# Patient Record
Sex: Male | Born: 2000 | Race: Black or African American | Hispanic: No | Marital: Single | Smoking: Never smoker
Health system: Southern US, Community
[De-identification: ages and names within clinical notes are randomized; demographics above are authoritative.]

## PROBLEM LIST (undated history)

## (undated) DIAGNOSIS — G4730 Sleep apnea, unspecified: Secondary | ICD-10-CM

## (undated) DIAGNOSIS — S060X9A Concussion with loss of consciousness of unspecified duration, initial encounter: Secondary | ICD-10-CM

## (undated) HISTORY — DX: Sleep apnea, unspecified: G47.30

## (undated) HISTORY — PX: KNEE SURGERY: SHX244

---
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
## 2018-02-11 ENCOUNTER — Other Ambulatory Visit: Payer: Self-pay

## 2018-02-11 ENCOUNTER — Emergency Department (HOSPITAL_COMMUNITY)
Admission: EM | Admit: 2018-02-11 | Discharge: 2018-02-11 | Disposition: A | Discharge status: Home / Self Care | Payer: No Typology Code available for payment source | Attending: Emergency Medicine | Admitting: Emergency Medicine

## 2018-02-11 ENCOUNTER — Emergency Department (HOSPITAL_COMMUNITY): Payer: No Typology Code available for payment source

## 2018-02-11 ENCOUNTER — Encounter (HOSPITAL_COMMUNITY): Payer: Self-pay | Admitting: Emergency Medicine

## 2018-02-11 DIAGNOSIS — S0990XA Unspecified injury of head, initial encounter: Secondary | ICD-10-CM | POA: Diagnosis present

## 2018-02-11 DIAGNOSIS — Y999 Unspecified external cause status: Secondary | ICD-10-CM | POA: Diagnosis not present

## 2018-02-11 DIAGNOSIS — S0083XA Contusion of other part of head, initial encounter: Secondary | ICD-10-CM | POA: Insufficient documentation

## 2018-02-11 DIAGNOSIS — Z79899 Other long term (current) drug therapy: Secondary | ICD-10-CM | POA: Diagnosis not present

## 2018-02-11 DIAGNOSIS — S0093XA Contusion of unspecified part of head, initial encounter: Secondary | ICD-10-CM

## 2018-02-11 DIAGNOSIS — Y9389 Activity, other specified: Secondary | ICD-10-CM | POA: Diagnosis not present

## 2018-02-11 DIAGNOSIS — Y929 Unspecified place or not applicable: Secondary | ICD-10-CM | POA: Diagnosis not present

## 2018-02-11 HISTORY — DX: Concussion with loss of consciousness of unspecified duration, initial encounter: S06.0X9A

## 2018-02-11 NOTE — Discharge Instructions (Addendum)
Tylenol for headaches.  Return if any problems.   

## 2018-02-11 NOTE — ED Notes (Signed)
Pt called out asking how long his stay might be.  This RN updated

## 2018-02-11 NOTE — ED Notes (Signed)
Patient transported to CT via wheelchair
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
## 2018-02-11 NOTE — ED Notes (Signed)
Patient able to ambulate independently  

## 2019-02-08 ENCOUNTER — Encounter (HOSPITAL_COMMUNITY): Payer: Self-pay

## 2019-02-08 ENCOUNTER — Ambulatory Visit (HOSPITAL_COMMUNITY)
Admission: EM | Admit: 2019-02-08 | Discharge: 2019-02-08 | Disposition: A | Discharge status: Home / Self Care | Payer: Medicaid Other | Attending: Emergency Medicine | Admitting: Emergency Medicine

## 2019-02-08 ENCOUNTER — Other Ambulatory Visit: Payer: Self-pay

## 2019-02-08 DIAGNOSIS — K921 Melena: Secondary | ICD-10-CM | POA: Diagnosis present

## 2019-02-08 DIAGNOSIS — Z202 Contact with and (suspected) exposure to infections with a predominantly sexual mode of transmission: Secondary | ICD-10-CM | POA: Insufficient documentation

## 2019-02-08 DIAGNOSIS — R369 Urethral discharge, unspecified: Secondary | ICD-10-CM | POA: Diagnosis not present

## 2019-02-08 LAB — OCCULT BLOOD, POC DEVICE: Fecal Occult Bld: POSITIVE — AB

## 2019-02-08 MED ORDER — LIDOCAINE HCL (PF) 1 % IJ SOLN
INTRAMUSCULAR | Status: AC
  Filled 2019-02-08: qty 2

## 2019-02-08 MED ORDER — AZITHROMYCIN 250 MG PO TABS
1000.0000 mg | ORAL_TABLET | Freq: Once | ORAL | Status: AC
  Administered 2019-02-08: 1000 mg via ORAL

## 2019-02-08 MED ORDER — CEFTRIAXONE SODIUM 250 MG IJ SOLR
250.0000 mg | Freq: Once | INTRAMUSCULAR | Status: AC
  Administered 2019-02-08: 250 mg via INTRAMUSCULAR

## 2019-02-08 MED ORDER — AZITHROMYCIN 250 MG PO TABS
ORAL_TABLET | ORAL | Status: AC
  Filled 2019-02-08: qty 4

## 2019-02-08 MED ORDER — CEFTRIAXONE SODIUM 250 MG IJ SOLR
INTRAMUSCULAR | Status: AC
  Filled 2019-02-08: qty 250

## 2019-02-08 NOTE — Discharge Instructions (Addendum)
You have blood in your stool.  Follow-up with your primary care provider or the provider listed below next week.   Schedule an appointment with a gastroenterologist to have this evaluated.    You were treated with two antibiotics today, Rocephin and Zithromax.  Do not have sex for 7 days. Your STD tests are pending.  If your test results are positive, we will call you.  You may need additional treatment and your partner may also need treatment.

## 2019-02-08 NOTE — ED Provider Notes (Signed)
[facility]    CSN: [id number] Arrival date & time: 02/08/19  1752      History   Chief Complaint Chief Complaint  Patient presents with  . Blood in Stool    HPI Philip Garcia is a 18 y.o. male.   Patient presents with red blood in his stool intermittently x several months.  He also has creamy yellow penile discharge.  He requests STD testing and treatment, including HIV and RPR.  He denies testicular pain, dysuria, abdominal pain, back pain, fever, or other symptoms.    The history is provided by the patient.    Past Medical History:  Diagnosis Date  . Concussion 2018   playing football; had 2 in 2017    There are no active problems to display for this patient.   Past Surgical History:  Procedure Laterality Date  . KNEE SURGERY Bilateral        Home Medications    Prior to Admission medications   Medication Sig Start Date End Date Taking? Authorizing Provider  naproxen (NAPROSYN) 500 MG tablet Take 1 tablet (500 mg total) by mouth 2 (two) times daily with a meal. 09/25/16   Georgiana ShoreMitchell, Jessica B, PA-C    Family History Family History  Family history unknown: Yes    Social History Social History   Tobacco Use  . Smoking status: Never Smoker  . Smokeless tobacco: Never Used  Substance Use Topics  . Alcohol use: Never    Frequency: Never  . Drug use: Never     Allergies   Patient has no known allergies.   Review of Systems Review of Systems  Constitutional: Negative for chills and fever.  HENT: Negative for ear pain and sore throat.   Eyes: Negative for pain and visual disturbance.  Respiratory: Negative for cough and shortness of breath.   Cardiovascular: Negative for chest pain and palpitations.  Gastrointestinal: Positive for blood in stool. Negative for abdominal pain, constipation, diarrhea and vomiting.  Genitourinary: Positive for discharge. Negative for dysuria, flank pain, hematuria and testicular pain.  Musculoskeletal:  Negative for arthralgias and back pain.  Skin: Negative for color change and rash.  Neurological: Negative for seizures and syncope.  All other systems reviewed and are negative.    Physical Exam Triage Vital Signs ED Triage Vitals [02/08/19 1821]  Enc Vitals Group     BP 120/73     Pulse Rate (!) 57     Resp 17     Temp 98.5 F (36.9 C)     Temp Source Oral     SpO2 99 %     Weight      Height      Head Circumference      Peak Flow      Pain Score      Pain Loc      Pain Edu?      Excl. in GC?    No data found.  Updated Vital Signs BP 120/73 (BP Location: Right Arm)   Pulse (!) 57   Temp 98.5 F (36.9 C) (Oral)   Resp 17   SpO2 99%   Visual Acuity Right Eye Distance:   Left Eye Distance:   Bilateral Distance:    Right Eye Near:   Left Eye Near:    Bilateral Near:     Physical Exam Vitals signs and nursing note reviewed.  Constitutional:      Appearance: He is well-developed.  HENT:     Head: Normocephalic and  atraumatic.     Mouth/Throat:     Mouth: Mucous membranes are moist.     Pharynx: Oropharynx is clear.  Eyes:     Conjunctiva/sclera: Conjunctivae normal.  Neck:     Musculoskeletal: Neck supple.  Cardiovascular:     Rate and Rhythm: Normal rate and regular rhythm.     Heart sounds: No murmur.  Pulmonary:     Effort: Pulmonary effort is normal. No respiratory distress.     Breath sounds: Normal breath sounds.  Abdominal:     General: Bowel sounds are normal.     Palpations: Abdomen is soft.     Tenderness: There is no abdominal tenderness. There is no right CVA tenderness, left CVA tenderness, guarding or rebound.  Genitourinary:    Penis: Discharge present.      Scrotum/Testes: Normal.     Epididymis:     Right: Normal.     Left: Normal.  Skin:    General: Skin is warm and dry.     Findings: No rash.  Neurological:     Mental Status: He is alert.      UC Treatments / Results  Labs (all labs ordered are listed, but only  abnormal results are displayed) Labs Reviewed  HIV ANTIBODY (ROUTINE TESTING W REFLEX)  RPR  POC OCCULT BLOOD, ED  CYTOLOGY, (ORAL, ANAL, URETHRAL) ANCILLARY ONLY    EKG   Radiology No results found.  Procedures Procedures (including critical care time)  Medications Ordered in UC Medications  azithromycin (ZITHROMAX) tablet 1,000 mg (has no administration in time range)  cefTRIAXone (ROCEPHIN) injection 250 mg (has no administration in time range)  azithromycin (ZITHROMAX) 250 MG tablet (has no administration in time range)  cefTRIAXone (ROCEPHIN) 250 MG injection (has no administration in time range)  lidocaine (PF) (XYLOCAINE) 1 % injection (has no administration in time range)    Initial Impression / Assessment and Plan / UC Course  I have reviewed the triage vital signs and the nursing notes.  Pertinent labs & imaging results that were available during my care of the patient were reviewed by me and considered in my medical decision making (see chart for details).    Hematochezia.  Penile discharge, possible STD.  Treating with Rocephin and Zithromax.  Urethral swab for STDs performed.  Discussed with patient that we will call him if his STD tests are positive and that he and his partner may need treatment at that time.  HIV and RPR test performed here.  Instructed patient to follow-up with his PCP or the suggested PCP to evaluate the positive occult blood in his stool.  Suggested that he may need to see a gastroenterologist as this has been going on for several months.  Patient agrees to plan of care.     Final Clinical Impressions(s) / UC Diagnoses   Final diagnoses:  Hematochezia  Penile discharge  Possible exposure to STD     Discharge Instructions     You have blood in your stool.  Follow-up with your primary care provider or the provider listed below next week.   Schedule an appointment with a gastroenterologist to have this evaluated.    You were treated  with two antibiotics today, Rocephin and Zithromax.  Do not have sex for 7 days. Your STD tests are pending.  If your test results are positive, we will call you.  You may need additional treatment and your partner may also need treatment.  ED Prescriptions    None     Controlled Substance Prescriptions [place] Controlled Substance Registry consulted? Not Applicable   Mickie Bail, NP 02/08/19 1850

## 2019-02-08 NOTE — ED Triage Notes (Signed)
Pt presents with blood in stool; pt states sometime he has generalized abdominal pain but has been having this issue for about a year.

## 2019-02-09 LAB — RPR: RPR Ser Ql: NONREACTIVE

## 2019-02-09 LAB — HIV ANTIBODY (ROUTINE TESTING W REFLEX): HIV Screen 4th Generation wRfx: NONREACTIVE

## 2019-02-11 ENCOUNTER — Encounter: Payer: Self-pay | Admitting: Gastroenterology

## 2019-02-12 LAB — CYTOLOGY, (ORAL, ANAL, URETHRAL) ANCILLARY ONLY
Chlamydia: NEGATIVE
Neisseria Gonorrhea: POSITIVE — AB
Trichomonas: NEGATIVE

## 2019-02-13 ENCOUNTER — Telehealth (HOSPITAL_COMMUNITY): Payer: Self-pay | Admitting: Emergency Medicine

## 2019-02-13 NOTE — Telephone Encounter (Signed)
Test for gonorrhea was positive. This was treated at the urgent care visit with IM rocephin 250mg and po zithromax 1g. Pt needs education to refrain from sexual intercourse for 7 days after treatment to give the medicine time to work. Sexual partners need to be notified and tested/treated. Condoms may reduce risk of reinfection. Recheck or followup with PCP for further evaluation if symptoms are not improving. [facility] notified.   Patient contacted and made aware of    results, all questions answered   

## 2019-02-22 ENCOUNTER — Other Ambulatory Visit: Payer: Self-pay

## 2019-02-22 ENCOUNTER — Encounter (HOSPITAL_COMMUNITY): Payer: Self-pay

## 2019-02-22 ENCOUNTER — Emergency Department (HOSPITAL_COMMUNITY)
Admission: EM | Admit: 2019-02-22 | Discharge: 2019-02-22 | Disposition: A | Discharge status: Home / Self Care | Payer: Medicaid Other | Attending: Emergency Medicine | Admitting: Emergency Medicine

## 2019-02-22 DIAGNOSIS — J029 Acute pharyngitis, unspecified: Secondary | ICD-10-CM | POA: Diagnosis not present

## 2019-02-22 DIAGNOSIS — R509 Fever, unspecified: Secondary | ICD-10-CM | POA: Insufficient documentation

## 2019-02-22 LAB — GROUP A STREP BY PCR: Group A Strep by PCR: NOT DETECTED

## 2019-02-22 MED ORDER — AMOXICILLIN 500 MG PO CAPS: 500.0000 mg | ORAL_CAPSULE | Freq: Three times a day (TID) | ORAL | 0 refills | Status: DC

## 2019-02-22 MED ORDER — AMOXICILLIN 500 MG PO CAPS
500.0000 mg | ORAL_CAPSULE | Freq: Once | ORAL | Status: AC
Start: 2019-02-22 — End: 2019-02-22
  Administered 2019-02-22: 23:00:00 500 mg via ORAL
  Filled 2019-02-22: qty 1

## 2019-02-22 NOTE — Discharge Instructions (Signed)
Take antibiotics as prescribed.  You are contagious for approximately 24 hours from the time you start the antibiotics.  Please drink plenty of fluids.  Return to the emergency department for new or worsening symptoms.  You can take Tylenol or Motrin for pain.

## 2019-02-22 NOTE — ED Provider Notes (Signed)
[facility] EMERGENCY DEPARTMENT Provider Note   CSN: [id number] Arrival date & time: 02/22/19  1957     History   Chief Complaint Chief Complaint  Patient presents with  . Sore Throat  . Fever    HPI Philip Garcia is a 18 y.o. male.     Patient presents to the emergency department with a chief complaint of sudden onset sore throat.  He states his symptoms started this morning.  States that he felt hot last night.  He measured a temperature this morning and it was 101.  He denies any cough.  Denies any known exposures to coronavirus.  Denies any other associated symptoms.  The history is provided by the patient. No language interpreter was used.    Past Medical History:  Diagnosis Date  . Concussion 2018   playing football; had 2 in 2017    There are no active problems to display for this patient.   Past Surgical History:  Procedure Laterality Date  . KNEE SURGERY Bilateral         Home Medications    Prior to Admission medications   Medication Sig Start Date End Date Taking? Authorizing Provider  amoxicillin (AMOXIL) 500 MG capsule Take 1 capsule (500 mg total) by mouth 3 (three) times daily. 02/22/19   Montine Circle, PA-C  naproxen (NAPROSYN) 500 MG tablet Take 1 tablet (500 mg total) by mouth 2 (two) times daily with a meal. 09/25/16   Emeline General, PA-C    Family History Family History  Family history unknown: Yes    Social History Social History   Tobacco Use  . Smoking status: Never Smoker  . Smokeless tobacco: Never Used  Substance Use Topics  . Alcohol use: Never    Frequency: Never  . Drug use: Never     Allergies   Patient has no known allergies.   Review of Systems Review of Systems  All other systems reviewed and are negative.    Physical Exam Updated Vital Signs BP 110/73   Pulse (!) 111   Temp 99.5 F (37.5 C) (Oral)   Resp 20   SpO2 95%   Physical Exam Vitals signs and nursing note  reviewed.  Constitutional:      General: He is not in acute distress.    Appearance: He is well-developed. He is not ill-appearing.  HENT:     Head: Normocephalic and atraumatic.     Mouth/Throat:     Comments: Oropharynx is erythematous, no tonsillar exudate or abscess, positive anterior cervical adenopathy Eyes:     Conjunctiva/sclera: Conjunctivae normal.  Neck:     Musculoskeletal: Neck supple.  Cardiovascular:     Rate and Rhythm: Normal rate.  Pulmonary:     Effort: Pulmonary effort is normal. No respiratory distress.  Abdominal:     General: There is no distension.  Musculoskeletal:     Comments: Moves all extremities  Skin:    General: Skin is warm and dry.  Neurological:     Mental Status: He is alert and oriented to person, place, and time.  Psychiatric:        Mood and Affect: Mood normal.        Behavior: Behavior normal.      ED Treatments / Results  Labs (all labs ordered are listed, but only abnormal results are displayed) Labs Reviewed  GROUP A STREP BY PCR    EKG None  Radiology No results found.  Procedures Procedures (including critical care  time)  Medications Ordered in ED Medications  amoxicillin (AMOXIL) capsule 500 mg (has no administration in time range)     Initial Impression / Assessment and Plan / ED Course  I have reviewed the triage vital signs and the nursing notes.  Pertinent labs & imaging results that were available during my care of the patient were reviewed by me and considered in my medical decision making (see chart for details).        Patient with sore throat, suspicious for strep throat.  Will treat with amoxicillin.   Final Clinical Impressions(s) / ED Diagnoses   Final diagnoses:  Acute pharyngitis, unspecified etiology    ED Discharge Orders         Ordered    amoxicillin (AMOXIL) 500 MG capsule  3 times daily     02/22/19 2259           Roxy Horseman, PA-C 02/22/19 2302    Melene Plan, DO  02/22/19 2329

## 2019-02-22 NOTE — ED Triage Notes (Signed)
Pt c.o sore throat and fever for 2 days, denies COVID exposure.

## 2019-03-12 ENCOUNTER — Other Ambulatory Visit (INDEPENDENT_AMBULATORY_CARE_PROVIDER_SITE_OTHER): Payer: Medicaid Other

## 2019-03-12 ENCOUNTER — Encounter: Payer: Self-pay | Admitting: Gastroenterology

## 2019-03-12 ENCOUNTER — Ambulatory Visit (INDEPENDENT_AMBULATORY_CARE_PROVIDER_SITE_OTHER): Payer: Medicaid Other | Admitting: Gastroenterology

## 2019-03-12 VITALS — BP 108/60 | HR 60 | Temp 97.1°F | Ht 71.0 in | Wt 139.4 lb

## 2019-03-12 DIAGNOSIS — K625 Hemorrhage of anus and rectum: Secondary | ICD-10-CM | POA: Diagnosis not present

## 2019-03-12 DIAGNOSIS — Z1159 Encounter for screening for other viral diseases: Secondary | ICD-10-CM | POA: Diagnosis not present

## 2019-03-12 LAB — CBC WITH DIFFERENTIAL/PLATELET
Basophils Absolute: 0 10*3/uL (ref 0.0–0.1)
Basophils Relative: 0.8 % (ref 0.0–3.0)
Eosinophils Absolute: 0 10*3/uL (ref 0.0–0.7)
Eosinophils Relative: 0.8 % (ref 0.0–5.0)
HCT: 44.3 % (ref 39.0–52.0)
Hemoglobin: 15.2 g/dL (ref 13.0–17.0)
Lymphocytes Relative: 31.8 % (ref 12.0–46.0)
Lymphs Abs: 1.5 10*3/uL (ref 0.7–4.0)
MCHC: 34.4 g/dL (ref 30.0–36.0)
MCV: 93.4 fl (ref 78.0–100.0)
Monocytes Absolute: 0.4 10*3/uL (ref 0.1–1.0)
Monocytes Relative: 7.5 % (ref 3.0–12.0)
Neutro Abs: 2.8 10*3/uL (ref 1.4–7.7)
Neutrophils Relative %: 59.1 % (ref 43.0–77.0)
Platelets: 240 10*3/uL (ref 150.0–400.0)
RBC: 4.74 Mil/uL (ref 4.22–5.81)
RDW: 13.3 % (ref 11.5–14.6)
WBC: 4.7 10*3/uL (ref 4.5–10.5)

## 2019-03-12 NOTE — Patient Instructions (Signed)
You have been scheduled for a flexible sigmoidoscopy. Please follow the written instructions given to you at your visit today. If you use inhalers (even only as needed), please bring them with you on the day of your procedure.  Due to recent COVID-19 restrictions implemented by [employer] and state authorities and in an effort to keep both patients and staff as safe as possible, [place] requires COVID-19 testing prior to any scheduled endoscopic procedure. The testing center is located at [address] [place], [place] [address].  Testing will be performed Monday through Friday 8 am- 4 pm.  You will require your COVID screen 2 days prior to your endoscopic procedure.  You are not required to quarantine after your screening.  You will only receive a phone call with the results if it is POSITIVE.  If you do not receive a call the day before your procedure you should begin your prep, if ordered, and you should report to the endo center for your procedure at your designated appointment arrival time ( one hour prior to the procedure time). There is no cost to you for the screening on the day of the swab.  [facility] will file with your insurance company for the testing.    You may receive an automated phone call prior to your procedure or have a message in your MyChart that you have an appointment for a BP/15 at the [facility], please disregard this message.  Your testing will be at the [address] , [address] location.    Your provider has requested that you go to the basement level for lab work before leaving today. Press "B" on the elevator. The lab is located at the first door on the left as you exit the elevator.  Thank you for entrusting me with your care and choosing [facility].  Dr Ardis Hughs

## 2019-03-12 NOTE — Progress Notes (Signed)
HPI: This is a very pleasant 18 year old man who was self-referred on the advice of a urgent clinic caregiver  Chief complaint is rectal bleeding  He had  urethral discharge and presented to the urgent clinic September 2020 with this.  Urethral swab proved that he was positive for gonorrhea.  He was treated with Rocephin injection as well as Zithromax.  HIV testing was negative.  Stool for FOB was positive for blood.  He believes he caught gonorrhea from a girl in [place].  He does not have anal sex.  He is straight.  The urethral discharge have been going on for about a day before he was evaluated.  It completely cleared up quickly on his antibiotics  For about a year he has had bright red blood per rectum with via bowel movements.  No diarrhea.  Only mild constipation at times.  No discomfort at his bottom.  He feels that the blood is high-volume at times.  No abdominal discomforts.  No weight loss.  His paternal grandfather had colon cancer.  His maternal grandfather had pancreatic cancer.  He is not aware of any family history of Crohn's or colitis.    Review of systems: Pertinent positive and negative review of systems were noted in the above HPI section. All other review negative.   Past Medical History:  Diagnosis Date  . Concussion 2018   playing football; had 2 in 2017    Past Surgical History:  Procedure Laterality Date  . KNEE SURGERY Bilateral     No current outpatient medications on file.   No current facility-administered medications for this visit.     Allergies as of 03/12/2019  . (No Known Allergies)    Family History  Problem Relation Age of Onset  . Pancreatic cancer Maternal Grandfather   . Colon cancer Paternal Grandfather     Social History   Socioeconomic History  . Marital status: Single    Spouse name: Not on file  . Number of children: Not on file  . Years of education: Not on file  . Highest education level: Not on file   Occupational History  . Not on file  Social Needs  . Financial resource strain: Not on file  . Food insecurity    Worry: Not on file    Inability: Not on file  . Transportation needs    Medical: Not on file    Non-medical: Not on file  Tobacco Use  . Smoking status: Never Smoker  . Smokeless tobacco: Never Used  Substance and Sexual Activity  . Alcohol use: Yes    Frequency: Never    Comment: occ  . Drug use: Never  . Sexual activity: Not on file  Lifestyle  . Physical activity    Days per week: Not on file    Minutes per session: Not on file  . Stress: Not on file  Relationships  . Social Herbalist on phone: Not on file    Gets together: Not on file    Attends religious service: Not on file    Active member of club or organization: Not on file    Attends meetings of clubs or organizations: Not on file    Relationship status: Not on file  . Intimate partner violence    Fear of current or ex partner: Not on file    Emotionally abused: Not on file    Physically abused: Not on file    Forced sexual activity: Not  on file  Other Topics Concern  . Not on file  Social History Narrative  . Not on file     Physical Exam: BP 108/60   Pulse 60   Temp (!) 97.1 F (36.2 C)   Ht 5\' 11"  (1.803 m)   Wt 139 lb 6.4 oz (63.2 kg)   BMI 19.44 kg/m  Constitutional: generally well-appearing Psychiatric: alert and oriented x3 Eyes: extraocular movements intact Mouth: oral pharynx moist, no lesions Neck: supple no lymphadenopathy Cardiovascular: heart regular rate and rhythm Lungs: clear to auscultation bilaterally Abdomen: soft, nontender, nondistended, no obvious ascites, no peritoneal signs, normal bowel sounds Extremities: no lower extremity edema bilaterally Skin: no lesions on visible extremities Rectal examination:, No obvious external hemorrhoids, no anal fissures, no digital rectal masses.  Stool was brown and not checked for Hemoccult  Assessment and  plan: 18 y.o. male with intermittent rectal bleeding  This seems unrelated to the gonorrhea that he had last month.  Clearly predates that acute STD.  I think it is unlikely anything serious.  Possibly internal hemorrhoids.  He has no significant constipation that would put him at risk for hemorrhoids.  I recommended flexible sigmoidoscopy to exclude other potential causes such as neoplasm as well as a CBC at his soonest convenience.   Please see the "Patient Instructions" section for addition details about the plan.   24, MD [facility] 03/12/2019, 9:41 AM  Cc: No ref. provider found

## 2019-03-29 ENCOUNTER — Other Ambulatory Visit: Payer: Self-pay | Admitting: Gastroenterology

## 2019-03-29 LAB — SARS CORONAVIRUS 2 (TAT 6-24 HRS): SARS Coronavirus 2: NEGATIVE

## 2019-04-02 ENCOUNTER — Ambulatory Visit (AMBULATORY_SURGERY_CENTER): Payer: Medicaid Other | Admitting: Gastroenterology

## 2019-04-02 ENCOUNTER — Encounter: Payer: Self-pay | Admitting: Gastroenterology

## 2019-04-02 ENCOUNTER — Other Ambulatory Visit: Payer: Self-pay

## 2019-04-02 VITALS — BP 103/55 | HR 49 | Temp 99.1°F | Resp 18 | Ht 71.0 in | Wt 139.0 lb

## 2019-04-02 DIAGNOSIS — K625 Hemorrhage of anus and rectum: Secondary | ICD-10-CM

## 2019-04-02 DIAGNOSIS — K529 Noninfective gastroenteritis and colitis, unspecified: Secondary | ICD-10-CM

## 2019-04-02 MED ORDER — SODIUM CHLORIDE 0.9 % IV SOLN: 500.0000 mL | Freq: Once | INTRAVENOUS | Status: DC

## 2019-04-02 NOTE — Patient Instructions (Signed)
Read all of the handouts given to you by your recovery room nurse.  Thank-you for choosing [place] for your medical needs today.  YOU HAD AN ENDOSCOPIC PROCEDURE TODAY AT [facility]:   Refer to the procedure report that was given to you for any specific questions about what was found during the examination.  If the procedure report does not answer your questions, please call your gastroenterologist to clarify.  If you requested that your care partner not be given the details of your procedure findings, then the procedure report has been included in a sealed envelope for you to review at your convenience later.  YOU SHOULD EXPECT: Some feelings of bloating in the abdomen. Passage of more gas than usual.  Walking can help get rid of the air that was put into your GI tract during the procedure and reduce the bloating. If you had a lower endoscopy (such as a colonoscopy or flexible sigmoidoscopy) you may notice spotting of blood in your stool or on the toilet paper. If you underwent a bowel prep for your procedure, you may not have a normal bowel movement for a few days.  Please Note:  You might notice some irritation and congestion in your nose or some drainage.  This is from the oxygen used during your procedure.  There is no need for concern and it should clear up in a day or so.  SYMPTOMS TO REPORT IMMEDIATELY:   Following lower endoscopy (colonoscopy or flexible sigmoidoscopy):  Excessive amounts of blood in the stool  Significant tenderness or worsening of abdominal pains  Swelling of the abdomen that is new, acute  Fever of 100F or higher   For urgent or emergent issues, a gastroenterologist can be reached at any hour by calling [phone].   DIET:  We do recommend a small meal at first, but then you may proceed to your regular diet.  Drink plenty of fluids but you should avoid alcoholic beverages for 24 hours.  ACTIVITY:  You should plan to take it easy for the rest of  today and you should NOT DRIVE or use heavy machinery until tomorrow (because of the sedation medicines used during the test).    FOLLOW UP: Our staff will call the number listed on your records 48-72 hours following your procedure to check on you and address any questions or concerns that you may have regarding the information given to you following your procedure. If we do not reach you, we will leave a message.  We will attempt to reach you two times.  During this call, we will ask if you have developed any symptoms of COVID 19. If you develop any symptoms (ie: fever, flu-like symptoms, shortness of breath, cough etc.) before then, please call [phone].  If you test positive for Covid 19 in the 2 weeks post procedure, please call and report this information to [place].    If any biopsies were taken you will be contacted by phone or by letter within the next 1-3 weeks.  Please call us at [phone] if you have not heard about the biopsies in 3 weeks.    SIGNATURES/CONFIDENTIALITY: You and/or your care partner have signed paperwork which will be entered into your electronic medical record.  These signatures attest to the fact that that the information above on your After Visit Summary has been reviewed and is understood.  Full responsibility of the confidentiality of this discharge information lies with you and/or your care-partner.

## 2019-04-02 NOTE — Progress Notes (Signed)
Called to room to assist during endoscopic procedure.  Patient ID and intended procedure confirmed with present staff. Received instructions for my participation in the procedure from the performing physician.  

## 2019-04-02 NOTE — Op Note (Signed)
[facility] Patient Name: Philip Garcia Procedure Date: 04/02/2019 2:43 PM MRN: [id number] Endoscopist: Milus Banister , MD Age: 18 Referring MD:  Date of Birth: 01/27/2001 Gender: Male Account #: [id number] Procedure:                Flexible Sigmoidoscopy Indications:              minor intermittent rectal bleeding with BMs Medicines:                Monitored Anesthesia Care Procedure:                Pre-Anesthesia Assessment:                           - Prior to the procedure, a History and Physical                            was performed, and patient medications and                            allergies were reviewed. The patient's tolerance of                            previous anesthesia was also reviewed. The risks                            and benefits of the procedure and the sedation                            options and risks were discussed with the patient.                            All questions were answered, and informed consent                            was obtained. Prior Anticoagulants: The patient has                            taken no previous anticoagulant or antiplatelet                            agents. ASA Grade Assessment: I - A normal, healthy                            patient. After reviewing the risks and benefits,                            the patient was deemed in satisfactory condition to                            undergo the procedure.                           After obtaining informed consent, the scope was  passed under direct vision. The Colonoscope was                            introduced through the anus and advanced to the the                            splenic flexure. The flexible sigmoidoscopy was                            accomplished without difficulty. The patient                            tolerated the procedure well. The quality of the                            bowel preparation was  good. Scope In: Scope Out: Findings:                 Mild inflammation characterized by erythema was                            found in the distal rectum without discrete                            transition to normal mucosa (this is possibly prep,                            enema related). Biopsies were taken with a cold                            forceps for histology.                           The exam was otherwise without abnormality. Complications:            No immediate complications. Estimated blood loss:                            None. Estimated Blood Loss:     Estimated blood loss: none. Impression:               - Mild inflammation was found in the distal rectum.                            Biopsied to check for IBD.                           - The examination was otherwise normal. No internal                            or external hemorrhoids. No anal fissures. Recommendation:           - Patient has a contact number available for                            emergencies. The signs and symptoms of  potential                            delayed complications were discussed with the                            patient. Return to normal activities tomorrow.                            Written discharge instructions were provided to the                            patient.                           - Resume previous diet.                           - Await final pathology report. Rachael Feeaniel P Magali Bray, MD 04/02/2019 2:58:03 PM This report has been signed electronically.

## 2019-04-02 NOTE — Progress Notes (Signed)
To PACU, VSS. Report to [email or web address] 

## 2019-04-04 ENCOUNTER — Telehealth: Payer: Self-pay

## 2019-04-04 NOTE — Telephone Encounter (Signed)
  Follow up Call-  Call back number 04/02/2019  Post procedure Call Back phone  # [phone]  Permission to leave phone message Yes     Patient questions:  Do you have a fever, pain , or abdominal swelling? No. Pain Score  0 *  Have you tolerated food without any problems? Yes.    Have you been able to return to your normal activities? Yes.    Do you have any questions about your discharge instructions: Diet   No. Medications  No. Follow up visit  No.  Do you have questions or concerns about your Care? No.  Actions: * If pain score is 4 or above: No action needed, pain <4.  1. Have you developed a fever since your procedure? no  2.   Have you had an respiratory symptoms (SOB or cough) since your procedure? no  3.   Have you tested positive for COVID 19 since your procedure no  4.   Have you had any family members/close contacts diagnosed with the COVID 19 since your procedure?  no   If yes to any of these questions please route to Joylene John, RN and Alphonsa Gin, Therapist, sports.

## 2019-04-08 ENCOUNTER — Encounter: Payer: Self-pay | Admitting: Gastroenterology

## 2019-05-14 ENCOUNTER — Other Ambulatory Visit: Payer: Self-pay

## 2019-05-14 ENCOUNTER — Ambulatory Visit (HOSPITAL_COMMUNITY)
Admission: EM | Admit: 2019-05-14 | Discharge: 2019-05-14 | Disposition: A | Discharge status: Home / Self Care | Payer: Medicaid Other | Attending: Family Medicine | Admitting: Family Medicine

## 2019-05-14 ENCOUNTER — Encounter (HOSPITAL_COMMUNITY): Payer: Self-pay

## 2019-05-14 DIAGNOSIS — Z202 Contact with and (suspected) exposure to infections with a predominantly sexual mode of transmission: Secondary | ICD-10-CM | POA: Diagnosis present

## 2019-05-14 NOTE — ED Triage Notes (Signed)
Pt states his partner told him to come in to get checked for a STD.

## 2019-05-14 NOTE — ED Provider Notes (Signed)
[facility]    CSN: [id number] Arrival date & time: 05/14/19  1712      History   Chief Complaint Chief Complaint  Patient presents with  . SEXUALLY TRANSMITTED DISEASE    HPI Paris Oleson is a 18 y.o. male.   Patient was told by sexual partner that she had chlamydia.  He has no symptoms no drainage discharge dysuria.  She requested that he be tested.  HPI  Past Medical History:  Diagnosis Date  . Concussion 2018   playing football; had 2 in 2017  . Sleep apnea     There are no problems to display for this patient.   Past Surgical History:  Procedure Laterality Date  . KNEE SURGERY Bilateral        Home Medications    Prior to Admission medications   Not on File    Family History Family History  Problem Relation Age of Onset  . Pancreatic cancer Maternal Grandfather   . Colon cancer Paternal Grandfather     Social History Social History   Tobacco Use  . Smoking status: Never Smoker  . Smokeless tobacco: Never Used  Substance Use Topics  . Alcohol use: Yes    Comment: occ  . Drug use: Never     Allergies   Patient has no known allergies.   Review of Systems Review of Systems  All other systems reviewed and are negative.    Physical Exam Triage Vital Signs ED Triage Vitals  Enc Vitals Group     BP 05/14/19 1739 107/69     Pulse Rate 05/14/19 1739 80     Resp 05/14/19 1739 16     Temp 05/14/19 1739 99.1 F (37.3 C)     Temp Source 05/14/19 1739 Oral     SpO2 05/14/19 1739 100 %     Weight 05/14/19 1741 140 lb (63.5 kg)     Height --      Head Circumference --      Peak Flow --      Pain Score 05/14/19 1741 7     Pain Loc --      Pain Edu? --      Excl. in [place]? --    No data found.  Updated Vital Signs BP 107/69 (BP Location: Right Arm)   Pulse 80   Temp 99.1 F (37.3 C) (Oral)   Resp 16   Wt 63.5 kg   SpO2 100%   BMI 19.53 kg/m   Visual Acuity Right Eye Distance:   Left Eye Distance:   Bilateral  Distance:    Right Eye Near:   Left Eye Near:    Bilateral Near:     Physical Exam Vitals and nursing note reviewed.  Genitourinary:    Penis: Normal.      Testes: Normal.     Comments: Sample taken from urinary meatus to check for GC chlamydia and trichomonas     UC Treatments / Results  Labs (all labs ordered are listed, but only abnormal results are displayed) Labs Reviewed - No data to display  EKG   Radiology No results found.  Procedures Procedures (including critical care time)  Medications Ordered in UC Medications - No data to display  Initial Impression / Assessment and Plan / UC Course  I have reviewed the triage vital signs and the nursing notes.  Pertinent labs & imaging results that were available during my care of the patient were reviewed by me and considered in  my medical decision making (see chart for details).     STD exposure, asymptomatic Final Clinical Impressions(s) / UC Diagnoses   Final diagnoses:  None   Discharge Instructions   None    ED Prescriptions    None     PDMP not reviewed this encounter.   Frederica Kuster, MD 05/14/19 [facility]

## 2019-05-16 LAB — CYTOLOGY, (ORAL, ANAL, URETHRAL) ANCILLARY ONLY
Chlamydia: NEGATIVE
Neisseria Gonorrhea: NEGATIVE
Trichomonas: NEGATIVE
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
## 2020-10-08 ENCOUNTER — Ambulatory Visit (INDEPENDENT_AMBULATORY_CARE_PROVIDER_SITE_OTHER): Payer: Medicaid Other | Admitting: Family Medicine

## 2020-10-08 ENCOUNTER — Other Ambulatory Visit: Payer: Self-pay

## 2020-10-08 ENCOUNTER — Encounter: Payer: Self-pay | Admitting: Family Medicine

## 2020-10-08 DIAGNOSIS — Z0000 Encounter for general adult medical examination without abnormal findings: Secondary | ICD-10-CM | POA: Diagnosis present

## 2020-10-08 DIAGNOSIS — R109 Unspecified abdominal pain: Secondary | ICD-10-CM | POA: Diagnosis not present

## 2020-10-08 DIAGNOSIS — G8929 Other chronic pain: Secondary | ICD-10-CM

## 2020-10-08 NOTE — Assessment & Plan Note (Addendum)
Noted during examination. Has had intermittent "stabbing" type pain on right flank, near rib area. Denies injury/trauma. Has been present x1 year. Improves with positioning. Murphy sign negative. Has not tried any medications for this. Given chronicity, do not feel imaging is warranted. Advised patient to take Tylenol/Ibuprofen for the pain. Appears musculoskeletal but he does have mild RUQ ttp. If the pain persists/worsens, can explore further with blood work (CMP to evaluate liver/GB) and CXR.

## 2020-10-08 NOTE — Assessment & Plan Note (Addendum)
Patient presents today to establish care.  He is otherwise young and healthy.  No screening blood work is necessary today.  He was counseled to find a dentist for annual cleanings and to get vision examination.  In regards to his sleep, we talked extensively about ways to improve sleep hygiene.  I did note mildly enlarged tonsils bilaterally, this could be contributing and referral to ENT could be considered in the future if no improvement with sleep hygiene. COVID vaccine booster offered today but patient declined.

## 2020-10-08 NOTE — Progress Notes (Signed)
    SUBJECTIVE:   CHIEF COMPLAINT / HPI:   Physical Exam Patient presents to establish care. He has no past medical history apart from potentially being diagnosed with sleep apnea at the age of 78 (he did a sleep study). He states that he sleeps 5-6 hours a night. Typically goes to bed around 2-3 AM wakes up around 8-9 AM. He watches TV before going to sleep. He otherwise has no other PMHx. Only surgeries have been meniscal repairs. Injuries were from basketball. Patient feels well otherwise, without complaints. Has not yet established with dentist or eye doctor. Moved to [place] from [place].   Social History:  Marijuana-couple times a week No tobacco. No other drugs. Social drinker: 1x/month Works for [employer].  PERTINENT  PMH / PSH: Meniscus repair in both knees (2018), hx sleep apnea   OBJECTIVE:   BP 110/64   Pulse 60   Ht 5' 10.5" (1.791 m)   Wt 137 lb 9.6 oz (62.4 kg)   SpO2 98%   BMI 19.46 kg/m    General: NAD, pleasant, able to participate in exam HEENT: Normocephalic, TMs clear bilaterally with scant cerumen, oropharynx clear without exudates, mildly enlarged tonsils Cardiac: RRR, no murmurs. Respiratory: CTAB, normal effort, No wheezes, rales or rhonchi Abdomen: Bowel sounds present, mild tenderness to palpation along RUQ with negative Murphy's sign, no rebound or guarding, nondistended, no hepatosplenomegaly. Extremities: no edema or cyanosis. Skin: warm and dry, no rashes noted Neuro: alert, no obvious focal deficits Psych: Normal affect and mood  ASSESSMENT/PLAN:   Encounter for medical examination to establish care Patient presents today to establish care.  He is otherwise young and healthy.  No screening blood work is necessary today.  He was counseled to find a dentist for annual cleanings and to get vision examination.  In regards to his sleep, we talked extensively about ways to improve sleep hygiene.  I did note mildly enlarged tonsils  bilaterally, this could be contributing and referral to ENT could be considered in the future if no improvement with sleep hygiene. COVID vaccine booster offered today but patient declined.   Chronic right flank pain Noted during examination. Has had intermittent "stabbing" type pain on right flank, near rib area. Denies injury/trauma. Has been present x1 year. Improves with positioning. Murphy sign negative. Has not tried any medications for this. Given chronicity, do not feel imaging is warranted. Advised patient to take Tylenol/Ibuprofen for the pain. Appears musculoskeletal but he does have mild RUQ ttp. If the pain persists/worsens, can explore further with blood work (CMP to evaluate liver/GB) and CXR.      Sabino Dick, DO [facility]

## 2020-10-08 NOTE — Patient Instructions (Signed)
It was wonderful to see you today!  Today we talked about:  -Find a local dentist, you should have two cleanings a year.  -Get your vision tested.  -I would recommend COVID vaccine booster.   -In order to help your sleep, I would recommend staying off all electronics for one hour prior to going to sleep. I would recommend that you stop watching TV in bed and try to train your body to sleep earlier. This will take time but I think it will help. Avoid being on your phone while in bed as well. Avoid eating right before bed.   -For the pain in your side, take Ibuprofen (with food) or Tylenol when you have it. If the pain worsens, please come back. I don't think we need to do any x-rays or blood work today.    Thank you for choosing [facility].   Please call [phone] with any questions about today's appointment.  Please be sure to schedule follow up at the front  desk before you leave today.   Sabino Dick, DO PGY-1 Family Medicine

## 2021-03-16 ENCOUNTER — Other Ambulatory Visit: Payer: Self-pay

## 2021-03-16 ENCOUNTER — Ambulatory Visit (INDEPENDENT_AMBULATORY_CARE_PROVIDER_SITE_OTHER): Payer: Medicaid Other

## 2021-03-16 DIAGNOSIS — Z23 Encounter for immunization: Secondary | ICD-10-CM

## 2021-08-12 ENCOUNTER — Ambulatory Visit (INDEPENDENT_AMBULATORY_CARE_PROVIDER_SITE_OTHER): Payer: Medicaid Other | Admitting: Family Medicine

## 2021-08-12 ENCOUNTER — Other Ambulatory Visit: Payer: Self-pay

## 2021-08-12 ENCOUNTER — Encounter: Payer: Self-pay | Admitting: Family Medicine

## 2021-08-12 VITALS — BP 125/71 | HR 50 | Wt 137.2 lb

## 2021-08-12 DIAGNOSIS — M79642 Pain in left hand: Secondary | ICD-10-CM | POA: Diagnosis not present

## 2021-08-12 DIAGNOSIS — M79641 Pain in right hand: Secondary | ICD-10-CM | POA: Diagnosis present

## 2021-08-12 NOTE — Progress Notes (Signed)
? ? ?  SUBJECTIVE:  ? ?CHIEF COMPLAINT / HPI:  ? ?"Issues with veins in hands": ?For some time has noticed some increased visibility of the vessels of his hands particularly in the morning.  He states that he does have some pain when he presses on the area around the veins of his bilateral hands.  He denies any other issues with swollen veins.  He states that his mom recommended he come in to be evaluated for this.  He denies other concerns.  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 ? ?PERTINENT  PMH / PSH: None relevant ? ?OBJECTIVE:  ? ?BP 125/71   Pulse (!) 50   Wt 137 lb 3.2 oz (62.2 kg)   SpO2 100%   BMI 19.41 kg/m?   ? ?General: NAD, pleasant, able to participate in exam ?Respiratory: No respiratory distress lungs clear to auscultation bilaterally ?Cardiac: Bradycardic with regular sounding rhythm and no murmurs ?Skin: warm and dry, bilateral hands with some normal-appearing vascularity, no overlying skin changes.  When palpating around the bones of the hand he does experience some mild discomfort in the region where his veins are visible.  When patient provides testing for grip strength there is no increase in bulging of the veins of the back of the hand. ?Psych: Normal affect and mood ? ?ASSESSMENT/PLAN:  ? ?Bilateral hand pain  increased vascularity of the hands: ?He has noted it for some time.  Has some increased vascularity of his hands from a subjective standpoint and notes some pain when palpating around the veins of the dorsal aspect of the hand.  On physical exam his vascularity appears within expected limits as he is a very lean individual with an athletic build.  He does have some discomfort when palpating around the hand particularly in the region of some of the vessels and I believe this is likely due to his low body fat and low amount of subcutaneous fat between the overlying skin and surrounding nerves.  I discussed with him that I can provide good reassurance that I do not  believe we need to do anything additionally.  He does not have any pain around his knuckles or other joints to suggest a joint based injury, arthritis, etc.  No set area having any more pain than any other area.  Recommended that if he notices any other changes or gets any other concerns to let us know.  Otherwise can follow-up as needed ?  ? ? ?Jackelyn Poling, DO ?[facility]  ?

## 2021-08-12 NOTE — Patient Instructions (Signed)
I suspect that the pain that you are having around the veins in your hand are simply due to your low body fat percentage and the fact that there is not a lot of cushion between the superficial skin, veins, and surrounding nerves.  I do not think we need to do anything else with regard to this but if you have any other concerns or notice any changes in the area in the morning that she would like for [place] to see you can always take a picture and send it to [place] on MyChart.  Otherwise can follow-up as needed. ?

## 2021-11-02 ENCOUNTER — Encounter: Payer: Self-pay | Admitting: *Deleted

## 2022-09-22 ENCOUNTER — Encounter (HOSPITAL_COMMUNITY): Payer: Self-pay

## 2022-09-22 ENCOUNTER — Ambulatory Visit (HOSPITAL_COMMUNITY)
Admission: EM | Admit: 2022-09-22 | Discharge: 2022-09-22 | Disposition: A | Discharge status: Home / Self Care | Payer: Medicaid Other | Attending: Emergency Medicine | Admitting: Emergency Medicine

## 2022-09-22 DIAGNOSIS — Z113 Encounter for screening for infections with a predominantly sexual mode of transmission: Secondary | ICD-10-CM | POA: Insufficient documentation

## 2022-09-22 MED ORDER — ONDANSETRON 4 MG PO TBDP
ORAL_TABLET | ORAL | Status: AC
  Filled 2022-09-22: qty 1

## 2022-09-22 MED ORDER — ONDANSETRON 4 MG PO TBDP: 4.0000 mg | ORAL_TABLET | Freq: Once | ORAL | Status: DC

## 2022-09-22 NOTE — ED Triage Notes (Signed)
Pt presents with c/o stomach cramping, clear penile discharge, nausea and vomiting x few days. Pt states he does not know why he has penile discharge. Last time he had an episode of vomiting was last night around midnight. Reports sharp chest pain.

## 2022-09-22 NOTE — Discharge Instructions (Signed)
We will call you if anything on your swab returns positive.  Results will also be available on MyChart. Please abstain from sexual intercourse until your results return. I recommend to always use barrier protection.

## 2022-09-22 NOTE — ED Provider Notes (Signed)
[facility]    CSN: [id number] Arrival date & time: 09/22/22  1337      History   Chief Complaint Chief Complaint  Patient presents with   Penile Discharge    HPI Philip Garcia is a 22 y.o. male.  Here with an episode of clear penile discharge.  Only happened once this morning. He denies any irritation, itching, dysuria, or rash.  No known exposure to STD. Does report unprotected intercourse 2 weeks ago No fevers  Past Medical History:  Diagnosis Date   Concussion 2018   playing football; had 2 in 2017   Sleep apnea     Patient Active Problem List   Diagnosis Date Noted   Encounter for medical examination to establish care 10/08/2020   Chronic right flank pain 10/08/2020    Past Surgical History:  Procedure Laterality Date   KNEE SURGERY Bilateral        Home Medications    Prior to Admission medications   Not on File    Family History Family History  Problem Relation Age of Onset   Pancreatic cancer Maternal Grandfather    Colon cancer Paternal Grandfather     Social History Social History   Tobacco Use   Smoking status: Never   Smokeless tobacco: Never  Vaping Use   Vaping Use: Never used  Substance Use Topics   Alcohol use: Yes    Comment: occ   Drug use: Yes    Types: Marijuana    Comment: occasional     Allergies   Patient has no known allergies.   Review of Systems Review of Systems As per HPI  Physical Exam Triage Vital Signs ED Triage Vitals  Enc Vitals Group     BP 09/22/22 1411 112/66     Pulse Rate 09/22/22 1411 62     Resp 09/22/22 1411 17     Temp 09/22/22 1411 98.6 F (37 C)     Temp Source 09/22/22 1411 Oral     SpO2 09/22/22 1411 97 %     Weight --      Height --      Head Circumference --      Peak Flow --      Pain Score 09/22/22 1410 7     Pain Loc --      Pain Edu? --      Excl. in GC? --    No data found.  Updated Vital Signs BP 112/66 (BP Location: Left Arm)   Pulse 62   Temp  98.6 F (37 C) (Oral)   Resp 17   SpO2 97%   Physical Exam Vitals and nursing note reviewed.  Constitutional:      General: He is not in acute distress.    Appearance: Normal appearance.  HENT:     Mouth/Throat:     Pharynx: Oropharynx is clear.  Cardiovascular:     Rate and Rhythm: Normal rate and regular rhythm.     Pulses: Normal pulses.     Heart sounds: Normal heart sounds.  Pulmonary:     Effort: Pulmonary effort is normal.     Breath sounds: Normal breath sounds.  Neurological:     Mental Status: He is alert and oriented to person, place, and time.     UC Treatments / Results  Labs (all labs ordered are listed, but only abnormal results are displayed) Labs Reviewed  CYTOLOGY, (ORAL, ANAL, URETHRAL) ANCILLARY ONLY    EKG   Radiology No results  found.  Procedures Procedures (including critical care time)  Medications Ordered in UC Medications - No data to display  Initial Impression / Assessment and Plan / UC Course  I have reviewed the triage vital signs and the nursing notes.  Pertinent labs & imaging results that were available during my care of the patient were reviewed by me and considered in my medical decision making (see chart for details).  Single episode of clear discharge this morning.  Cytology swab pending. Discussed will treat positive result if indicated. Recommend follow with PCP if persisting. Questions answered  Final Clinical Impressions(s) / UC Diagnoses   Final diagnoses:  Screen for STD (sexually transmitted disease)     Discharge Instructions      We will call you if anything on your swab returns positive.  Results will also be available on MyChart. Please abstain from sexual intercourse until your results return. I recommend to always use barrier protection.      ED Prescriptions   None    PDMP not reviewed this encounter.   Marlow Baars, [place] 09/22/22 1457

## 2022-09-23 LAB — CYTOLOGY, (ORAL, ANAL, URETHRAL) ANCILLARY ONLY
Chlamydia: NEGATIVE
Comment: NEGATIVE
Comment: NEGATIVE
Comment: NORMAL
Neisseria Gonorrhea: NEGATIVE
Trichomonas: NEGATIVE

## 2023-06-29 ENCOUNTER — Ambulatory Visit: Payer: Medicaid Other | Admitting: Family Medicine

## 2023-06-29 ENCOUNTER — Encounter: Payer: Self-pay | Admitting: Family Medicine

## 2023-06-29 VITALS — BP 110/70 | HR 74 | Ht 70.5 in | Wt 138.0 lb

## 2023-06-29 DIAGNOSIS — S99921A Unspecified injury of right foot, initial encounter: Secondary | ICD-10-CM | POA: Diagnosis present
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
## 2023-06-29 NOTE — Assessment & Plan Note (Addendum)
Ordered x ray of the right foot to evaluate for fracture. Advised tylenol/ibuprofen for pain control, and provided walking boot to offload the injured digit. Depending on size and location of fracture, may require referral to orthopedics. Will follow up after receiving and reviewing radiographs.

## 2023-06-29 NOTE — Patient Instructions (Addendum)
It was wonderful to see you today!  I have ordered an xray of your foot to see if it is broken. If there is a fracture, you may need to see a specialist, depending on the type of fracture and the location. In the meantime, you can use tylenol and ibuprofen for pain relief, and I will provide a CAM boot to help take pressure off the injury.   Please call [phone] with any questions about today's appointment.   If you need any additional refills, please call your pharmacy before calling the office.  Gerrit Heck, DO Family Medicine

## 2023-07-03 ENCOUNTER — Ambulatory Visit
Admission: RE | Admit: 2023-07-03 | Discharge: 2023-07-03 | Disposition: A | Discharge status: Home / Self Care | Payer: Medicaid Other | Source: Ambulatory Visit | Attending: Family Medicine

## 2023-07-03 DIAGNOSIS — S99921A Unspecified injury of right foot, initial encounter: Secondary | ICD-10-CM

## 2023-07-06 ENCOUNTER — Encounter: Payer: Self-pay | Admitting: Family Medicine

## 2023-07-12 ENCOUNTER — Ambulatory Visit: Payer: Medicaid Other | Admitting: Student

## 2023-07-13 ENCOUNTER — Ambulatory Visit: Payer: Medicaid Other | Admitting: Student

## 2023-07-13 ENCOUNTER — Encounter: Payer: Self-pay | Admitting: Student

## 2023-07-13 VITALS — BP 111/72 | HR 66 | Ht 70.5 in | Wt 136.2 lb

## 2023-07-13 DIAGNOSIS — S99921D Unspecified injury of right foot, subsequent encounter: Secondary | ICD-10-CM

## 2023-07-13 NOTE — Patient Instructions (Signed)
It was great to see you! Thank you for allowing me to participate in your care!  I recommend that you always bring your medications to each appointment as this makes it easy to ensure you are on the correct medications and helps [place] not miss when refills are needed.  Our plans for today:  - Your toe is likely very bruised and needs time to heal - You can take tylenol up to 1000 mg every 6 hours as needed and/or ibuprofen 400 to 600 mg every 6 hours as needed - If the boot makes it feel better, you can use it  We are checking some labs today, I will call you if they are abnormal will send you a MyChart message or a letter if they are normal.  If you do not hear about your labs in the next 2 weeks please let us know.  Take care and seek immediate care sooner if you develop any concerns.   Dr. Erick Alley, DO [facility]
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
## 2023-11-07 ENCOUNTER — Encounter: Payer: Self-pay | Admitting: *Deleted

## 2023-12-19 ENCOUNTER — Ambulatory Visit: Admitting: Student

## 2023-12-26 ENCOUNTER — Ambulatory Visit (INDEPENDENT_AMBULATORY_CARE_PROVIDER_SITE_OTHER): Payer: Self-pay | Admitting: Family Medicine

## 2023-12-26 VITALS — BP 92/76 | HR 59 | Wt 138.0 lb

## 2023-12-26 DIAGNOSIS — M545 Low back pain, unspecified: Secondary | ICD-10-CM

## 2023-12-26 DIAGNOSIS — F5102 Adjustment insomnia: Secondary | ICD-10-CM

## 2023-12-26 MED ORDER — DICLOFENAC SODIUM 1 % EX GEL
2.0000 g | Freq: Four times a day (QID) | CUTANEOUS | 1 refills | Status: AC | PRN
Start: 2023-12-26 — End: ?

## 2023-12-26 MED ORDER — MELATONIN 3 MG PO TABS
3.0000 mg | ORAL_TABLET | Freq: Every day | ORAL | 0 refills | Status: AC
Start: 2023-12-26 — End: ?
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
## 2023-12-26 NOTE — Patient Instructions (Addendum)
 It was great to see you today! Thank you for choosing [facility] for your primary care.  Today we addressed: 1. Acute left-sided low back pain without sciatica You can try a heating pad. Try Voltaren  gel over the areas that feel weird. Also do the exercises attached for 20-30 seconds each a few times a day. You had a physical therapy referral placed, they will call you to set up an appointment. Please give us  a call if you don't hear back in the next 2 weeks.  Sleep difficulties Please take melatonin 1-2 hours before bed. - Try the following to help you sleep better:  - limit naps during the day  - no screens (TV, phone, tablet, computer) at least 1-2 hours before bedtime.  - have a quiet and dark sleeping environment.  - no large meals or drinks about 1 hour before bed.  - Avoid taking diuretics (hydrochlorothiazide, furosemide) in the evenings.  - Avoid caffeine after 3pm.  - Exercise or move your body regularly every day.  - You can also try melatonin 5 mg over the counter. Take this 1-2 hours before bed. - If you are lying in bed for 30 mins-1 hour and aren't falling asleep, get out of bed and do something relaxing like reading (NO TV!) until you are tired.   You should return to our clinic in 4 weeks if not getting better.  Thank you for coming to see us  at [facility] and for the opportunity to care for you! Mckinnley Cottier, MD 12/26/2023, 4:26 PM

## 2024-01-01 ENCOUNTER — Encounter: Payer: Self-pay | Admitting: Family Medicine

## 2024-04-16 ENCOUNTER — Ambulatory Visit: Payer: Self-pay | Admitting: Student
